# Patient Record
Sex: Female | Born: 2000 | Race: Black or African American | Hispanic: No | Marital: Single | Smoking: Never smoker
Health system: Southern US, Community
[De-identification: ages and names within clinical notes are randomized; demographics above are authoritative.]

---
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
## 2021-08-24 ENCOUNTER — Ambulatory Visit (HOSPITAL_COMMUNITY)
Admission: EM | Admit: 2021-08-24 | Discharge: 2021-08-24 | Disposition: A | Discharge status: Home / Self Care | Payer: Medicaid Other | Attending: Nurse Practitioner | Admitting: Nurse Practitioner

## 2021-08-24 ENCOUNTER — Encounter (HOSPITAL_COMMUNITY): Payer: Self-pay

## 2021-08-24 DIAGNOSIS — J029 Acute pharyngitis, unspecified: Secondary | ICD-10-CM | POA: Insufficient documentation

## 2021-08-24 DIAGNOSIS — J069 Acute upper respiratory infection, unspecified: Secondary | ICD-10-CM | POA: Insufficient documentation

## 2021-08-24 LAB — POCT RAPID STREP A, ED / UC: Streptococcus, Group A Screen (Direct): NEGATIVE

## 2021-08-24 LAB — POC INFLUENZA A AND B ANTIGEN (URGENT CARE ONLY)
INFLUENZA A ANTIGEN, POC: NEGATIVE
INFLUENZA B ANTIGEN, POC: NEGATIVE

## 2021-08-24 MED ORDER — LIDOCAINE VISCOUS HCL 2 % MT SOLN: 5.0000 mL | Freq: Three times a day (TID) | OROMUCOSAL | 0 refills | Status: AC | PRN

## 2021-08-24 NOTE — ED Provider Notes (Signed)
?[facility] ? ? ? ?CSN: [id number] ?Arrival date & time: 08/24/21  1048 ? ? ?  ? ?History   ?Chief Complaint ?Chief Complaint  ?Patient presents with  ? Sore Throat  ? ? ?HPI ?Carla Contreras is a 21 y.o. female.  ? ?The patient is a 21 year old female who presents with her mother for complaints of sore throat.  She also would like to find out if she has toxic shock syndrome because she fell asleep with a tampon in place around 9 PM last night and removed it around 9 AM this morning.  States that she woke up with sore throat, headache, and is concerned that this is related to toxic shock syndrome.  She denies fever, ear pain, nasal congestion, runny nose, or cough.  She also denies any GI symptoms.  Patient has not been vaccinated for influenza or COVID.  She denies any sick contacts.  She has not taken any medication for her symptoms. ? ?The history is provided by the patient.  ? ?History reviewed. No pertinent past medical history. ? ?There are no problems to display for this patient. ? ? ?History reviewed. No pertinent surgical history. ? ?OB History   ?No obstetric history on file. ?  ? ? ? ?Home Medications   ? ?Prior to Admission medications   ?Medication Sig Start Date End Date Taking? Authorizing Provider  ?lidocaine (XYLOCAINE) 2 % solution Use as directed 5 mLs in the mouth or throat every 8 (eight) hours as needed for up to 5 days for mouth pain. Gargle and spit 20mL in the throat/mouth as needed for mouth pain. 08/24/21 08/29/21 Yes Huyen Perazzo-Warren, Sadie Haber, NP  ? ? ?Family History ?Family History  ?Problem Relation Age of Onset  ? Hypertension Mother   ? ? ?Social History ?Social History  ? ?Tobacco Use  ? Smoking status: Never  ? Smokeless tobacco: Never  ?Vaping Use  ? Vaping Use: Never used  ?Substance Use Topics  ? Alcohol use: Never  ? ? ? ?Allergies   ?Patient has no known allergies. ? ? ?Review of Systems ?Review of Systems  ?Constitutional:  Positive for activity change, appetite change,  chills and fatigue.  ?HENT:  Positive for sore throat and trouble swallowing. Negative for congestion, ear pain, sinus pressure, sinus pain and sneezing.   ?Eyes: Negative.   ?Respiratory: Negative.    ?Cardiovascular: Negative.   ?Gastrointestinal: Negative.   ?Skin: Negative.   ?Psychiatric/Behavioral: Negative.    ? ? ?Physical Exam ?Triage Vital Signs ?ED Triage Vitals  ?Enc Vitals Group  ?   BP 08/24/21 1126 130/78  ?   Pulse Rate 08/24/21 1126 91  ?   Resp 08/24/21 1126 18  ?   Temp 08/24/21 1126 98.3 ?F (36.8 ?C)  ?   Temp Source 08/24/21 1126 Oral  ?   SpO2 08/24/21 1126 98 %  ?   Weight --   ?   Height --   ?   Head Circumference --   ?   Peak Flow --   ?   Pain Score 08/24/21 1124 4  ?   Pain Loc --   ?   Pain Edu? --   ?   Excl. in GC? --   ? ?No data found. ? ?Updated Vital Signs ?BP 130/78 (BP Location: Left Arm)   Pulse 91   Temp 98.3 ?F (36.8 ?C) (Oral)   Resp 18   LMP 08/23/2021 (Exact Date)   SpO2 98%  ? ?  Visual Acuity ?Right Eye Distance:   ?Left Eye Distance:   ?Bilateral Distance:   ? ?Right Eye Near:   ?Left Eye Near:    ?Bilateral Near:    ? ?Physical Exam ?Vitals reviewed.  ?Constitutional:   ?   General: She is not in acute distress. ?   Appearance: She is well-developed.  ?HENT:  ?   Head: Normocephalic and atraumatic.  ?   Right Ear: Tympanic membrane and ear canal normal. Tympanic membrane is not erythematous.  ?   Left Ear: Tympanic membrane and ear canal normal. Tympanic membrane is not erythematous.  ?   Nose: No congestion or rhinorrhea.  ?   Mouth/Throat:  ?   Mouth: Mucous membranes are moist.  ?   Pharynx: Uvula midline. Pharyngeal swelling and posterior oropharyngeal erythema present.  ?   Tonsils: No tonsillar exudate. 1+ on the right. 1+ on the left.  ?Eyes:  ?   Conjunctiva/sclera: Conjunctivae normal.  ?   Pupils: Pupils are equal, round, and reactive to light.  ?Cardiovascular:  ?   Rate and Rhythm: Normal rate and regular rhythm.  ?   Heart sounds: Normal heart sounds.   ?Pulmonary:  ?   Effort: Pulmonary effort is normal.  ?   Breath sounds: Normal breath sounds.  ?Abdominal:  ?   General: Bowel sounds are normal.  ?   Palpations: Abdomen is soft.  ?Musculoskeletal:  ?   Cervical back: Normal range of motion and neck supple.  ?Skin: ?   General: Skin is warm and dry.  ?   Capillary Refill: Capillary refill takes less than 2 seconds.  ?Neurological:  ?   General: No focal deficit present.  ?   Mental Status: She is alert and oriented to person, place, and time.  ?Psychiatric:     ?   Mood and Affect: Mood normal.     ?   Behavior: Behavior normal.  ? ? ? ?UC Treatments / Results  ?Labs ?(all labs ordered are listed, but only abnormal results are displayed) ?Labs Reviewed  ?POCT RAPID STREP A, ED / UC  ?POC INFLUENZA A AND B ANTIGEN (URGENT CARE ONLY)  ? ? ?EKG ? ? ?Radiology ?No results found. ? ?Procedures ?Procedures (including critical care time) ? ?Medications Ordered in UC ?Medications - No data to display ? ?Initial Impression / Assessment and Plan / UC Course  ?I have reviewed the triage vital signs and the nursing notes. ? ?Pertinent labs & imaging results that were available during my care of the patient were reviewed by me and considered in my medical decision making (see chart for details). ? ?The patient is a 21 year old female who presents with her mother for complaints of sore throat.  She also states that she is concerned about toxic shock syndrome because she left a tampon in place for approximately 12 hours.  Patient states that she is concerned because she woke up with sore throat, headache, and fatigue.  Patient informed that her symptoms do not have any association with toxic shock syndrome she likely does not have this.  On exam, patient with tonsil swelling bilaterally, without lymphadenopathy or fever.  Rapid strep and influenza test were done, both were negative.  We will send a throat culture.  In the interim we will treat patient symptomatically with  viscous lidocaine for discomfort.  We will also recommend over-the-counter ibuprofen for pain, fever, or general discomfort.  Patient encouraged to increase fluids and get plenty of rest.  Patient's mother advised that she will be contacted if 3 throat culture is positive to provide treatment.  Follow-up as needed. ?Final Clinical Impressions(s) / UC Diagnoses  ? ?Final diagnoses:  ?Viral upper respiratory illness  ?Pharyngitis, unspecified etiology  ? ? ? ?Discharge Instructions   ? ?  ?Your influenza and strep test are negative today.  A throat culture has been ordered.  You will be contacted if the results are positive to provide treatment. ?May take ibuprofen or Tylenol for pain, fever, or general discomfort. ?Take medication as prescribed. ?Increase fluids and get plenty of rest. ?Warm salt water gargles 3-4 times daily as needed for throat pain. ?Follow-up if symptoms do not improve. ? ? ? ? ?ED Prescriptions   ? ? Medication Sig Dispense Auth. Provider  ? lidocaine (XYLOCAINE) 2 % solution Use as directed 5 mLs in the mouth or throat every 8 (eight) hours as needed for up to 5 days for mouth pain. Gargle and spit 5mL in the throat/mouth as needed for mouth pain. 75 mL Nicoya Friel-Warren, Sadie Haberhristie J, NP  ? ?  ? ?PDMP not reviewed this encounter. ?  ?Abran CantorLeath-Warren, Anndrea Mihelich J, NP ?08/24/21 1244 ? ?

## 2021-08-24 NOTE — Discharge Instructions (Addendum)
Your influenza and strep test are negative today.  A throat culture has been ordered.  You will be contacted if the results are positive to provide treatment. ?May take ibuprofen or Tylenol for pain, fever, or general discomfort. ?Take medication as prescribed. ?Increase fluids and get plenty of rest. ?Warm salt water gargles 3-4 times daily as needed for throat pain. ?Follow-up if symptoms do not improve. ?

## 2021-08-24 NOTE — ED Triage Notes (Signed)
Onset this morning of weakness, nausea, chills, HA and sore throat. No meds taken. No v/d. Pt is suspicious for toxic shock syndrome, noting that she placed the tampon around 9p last night and removed it this morning around 9:15a. No cough. No vaginal discharge, itching or irritation. ?

## 2021-08-26 LAB — CULTURE, GROUP A STREP (THRC)

## 2021-08-27 ENCOUNTER — Telehealth (HOSPITAL_COMMUNITY): Payer: Self-pay | Admitting: Emergency Medicine

## 2021-08-27 MED ORDER — AMOXICILLIN 500 MG PO CAPS: 500.0000 mg | ORAL_CAPSULE | Freq: Two times a day (BID) | ORAL | 0 refills | Status: AC

## 2021-09-09 ENCOUNTER — Ambulatory Visit (HOSPITAL_COMMUNITY)
Admission: EM | Admit: 2021-09-09 | Discharge: 2021-09-09 | Disposition: A | Discharge status: Home / Self Care | Payer: Medicaid Other | Attending: Nurse Practitioner | Admitting: Nurse Practitioner

## 2021-09-09 ENCOUNTER — Encounter (HOSPITAL_COMMUNITY): Payer: Self-pay | Admitting: Emergency Medicine

## 2021-09-09 DIAGNOSIS — N760 Acute vaginitis: Secondary | ICD-10-CM | POA: Diagnosis not present

## 2021-09-09 DIAGNOSIS — N390 Urinary tract infection, site not specified: Secondary | ICD-10-CM | POA: Diagnosis not present

## 2021-09-09 LAB — POC URINE PREG, ED: Preg Test, Ur: NEGATIVE

## 2021-09-09 LAB — POCT URINALYSIS DIPSTICK, ED / UC
Bilirubin Urine: NEGATIVE
Glucose, UA: NEGATIVE mg/dL
Hgb urine dipstick: NEGATIVE
Ketones, ur: NEGATIVE mg/dL
Nitrite: POSITIVE — AB
Protein, ur: NEGATIVE mg/dL
Specific Gravity, Urine: 1.015 (ref 1.005–1.030)
Urobilinogen, UA: 0.2 mg/dL (ref 0.0–1.0)
pH: 7.5 (ref 5.0–8.0)

## 2021-09-09 MED ORDER — NITROFURANTOIN MONOHYD MACRO 100 MG PO CAPS: 100.0000 mg | ORAL_CAPSULE | Freq: Two times a day (BID) | ORAL | 0 refills | Status: AC

## 2021-09-09 MED ORDER — FLUCONAZOLE 150 MG PO TABS: 150.0000 mg | ORAL_TABLET | Freq: Every day | ORAL | 0 refills | Status: DC

## 2021-09-09 NOTE — ED Triage Notes (Signed)
Pt reports vaginal itching and small amount of vaginal discharge x 1 day. States she think she has a yeast infection.  ?

## 2021-09-09 NOTE — ED Provider Notes (Signed)
?[facility] ? ? ? ?CSN: [id number] ?Arrival date & time: 09/09/21  1100 ? ? ?  ? ?History   ?Chief Complaint ?Chief Complaint  ?Patient presents with  ? Vaginal Itching  ? ? ?HPI ?Carla Contreras is a 21 y.o. female.  ? ?The patient is a 21 year old female who presents with vaginal symptoms.  Patient complains of vaginal itching, irritation, and discharge.  Symptoms started 1 day ago.  Patient also states that she has noted some swelling to her labia as a result of the irritation.  She denies vaginal odor, urinary frequency, urgency, hesitancy, or pelvic pain.  She is sexually active with 1 female partner, they currently do not use condoms.  Her last menstrual cycle was on 08/25/2021. ? ?The history is provided by the patient.  ? ?History reviewed. No pertinent past medical history. ? ?There are no problems to display for this patient. ? ? ?History reviewed. No pertinent surgical history. ? ?OB History   ?No obstetric history on file. ?  ? ? ? ?Home Medications   ? ?Prior to Admission medications   ?Medication Sig Start Date End Date Taking? Authorizing Provider  ?fluconazole (DIFLUCAN) 150 MG tablet Take 1 tablet (150 mg total) by mouth daily. Take one tablet today. If symptoms persist after 72 hours, take the second tablet. 09/09/21  Yes Berthel Bagnall-Warren, Sadie Haber, NP  ?nitrofurantoin, macrocrystal-monohydrate, (MACROBID) 100 MG capsule Take 1 capsule (100 mg total) by mouth 2 (two) times daily for 5 days. 09/09/21 09/14/21 Yes Niemah Schwebke-Warren, Sadie Haber, NP  ? ? ?Family History ?Family History  ?Problem Relation Age of Onset  ? Hypertension Mother   ? ? ?Social History ?Social History  ? ?Tobacco Use  ? Smoking status: Never  ? Smokeless tobacco: Never  ?Vaping Use  ? Vaping Use: Never used  ?Substance Use Topics  ? Alcohol use: Never  ? ? ? ?Allergies   ?Patient has no known allergies. ? ? ?Review of Systems ?Review of Systems  ?Constitutional: Negative.   ?Gastrointestinal: Negative.   ?Genitourinary:  Positive  for vaginal discharge and vaginal pain.  ?Skin: Negative.   ?Psychiatric/Behavioral: Negative.    ? ? ?Physical Exam ?Triage Vital Signs ?ED Triage Vitals  ?Enc Vitals Group  ?   BP 09/09/21 1312 129/81  ?   Pulse Rate 09/09/21 1312 61  ?   Resp 09/09/21 1312 17  ?   Temp 09/09/21 1312 99.1 ?F (37.3 ?C)  ?   Temp Source 09/09/21 1312 Oral  ?   SpO2 09/09/21 1311 98 %  ?   Weight 09/09/21 1311 164 lb 14.5 oz (74.8 kg)  ?   Height --   ?   Head Circumference --   ?   Peak Flow --   ?   Pain Score 09/09/21 1311 0  ?   Pain Loc --   ?   Pain Edu? --   ?   Excl. in GC? --   ? ?No data found. ? ?Updated Vital Signs ?BP 129/81 (BP Location: Left Arm)   Pulse 61   Temp 99.1 ?F (37.3 ?C) (Oral)   Resp 17   Wt 164 lb 14.5 oz (74.8 kg)   LMP 08/23/2021 (Exact Date)   SpO2 98%  ? ?Visual Acuity ?Right Eye Distance:   ?Left Eye Distance:   ?Bilateral Distance:   ? ?Right Eye Near:   ?Left Eye Near:    ?Bilateral Near:    ? ?Physical Exam ?Vitals reviewed.  ?Constitutional:   ?  General: She is not in acute distress. ?   Appearance: Normal appearance.  ?HENT:  ?   Head: Normocephalic.  ?Cardiovascular:  ?   Rate and Rhythm: Normal rate and regular rhythm.  ?Pulmonary:  ?   Effort: Pulmonary effort is normal.  ?   Breath sounds: Normal breath sounds.  ?Abdominal:  ?   General: Bowel sounds are normal.  ?   Palpations: Abdomen is soft.  ?Skin: ?   General: Skin is warm and dry.  ?   Capillary Refill: Capillary refill takes less than 2 seconds.  ?Neurological:  ?   General: No focal deficit present.  ?   Mental Status: She is alert and oriented to person, place, and time.  ?Psychiatric:     ?   Mood and Affect: Mood normal.     ?   Behavior: Behavior normal.  ? ? ? ?UC Treatments / Results  ?Labs ?(all labs ordered are listed, but only abnormal results are displayed) ?Labs Reviewed  ?POCT URINALYSIS DIPSTICK, ED / UC - Abnormal; Notable for the following components:  ?    Result Value  ? Nitrite POSITIVE (*)   ? Leukocytes,Ua  MODERATE (*)   ? All other components within normal limits  ?POC URINE PREG, ED  ?CERVICOVAGINAL ANCILLARY ONLY  ? ? ?EKG ? ? ?Radiology ?No results found. ? ?Procedures ?Procedures (including critical care time) ? ?Medications Ordered in UC ?Medications - No data to display ? ?Initial Impression / Assessment and Plan / UC Course  ?I have reviewed the triage vital signs and the nursing notes. ? ?Pertinent labs & imaging results that were available during my care of the patient were reviewed by me and considered in my medical decision making (see chart for details). ? ?The patient is a 21 year old female who presents with vaginal symptoms.  Symptoms have been present for 1 day.  Symptoms include vaginal irritation and discharge.  She denies any urinary symptoms.  STI testing was performed today.  Urine pregnancy. Urinalysis is positive for nitrites and leukocytes. Patient was advised that he results of her cytology test should be available within the next 24 to 48 hours.  She was advised she will be contacted if her results are positive to provide treatment.  Symptoms are consistent with a vaginitis and cystitis.  There is no concern for pyelonephritis in the absence of fever, CVA tenderness. Awaiting cytology results to ensure symptoms are not related to an STI.  In the interim, will treat with fluconazole and macrobid.  ?Final Clinical Impressions(s) / UC Diagnoses  ? ?Final diagnoses:  ?Vaginitis and vulvovaginitis  ?Acute UTI  ? ? ? ?Discharge Instructions   ? ?  ?Your urinalysis shows that you do have a urinary tract infection.  I have sent a prescription for Macrobid to the pharmacy.  Take 1 pill twice daily for 5 days.  I have also prescribed Fluconazole for your vaginal symptoms. ?Your urine pregnancy test is negative. ?Increase fluids.  You should be drinking at least 64 ounces of water daily. ?Void every 2 hours. ?You should void 15 to 20 minutes after intercourse to avoid development of a urinary tract  infection. ?You will be contacted if your cytology results are positive to provide treatment. ?Follow-up as needed. ? ? ? ? ?ED Prescriptions   ? ? Medication Sig Dispense Auth. Provider  ? fluconazole (DIFLUCAN) 150 MG tablet Take 1 tablet (150 mg total) by mouth daily. Take one tablet today. If symptoms persist after  72 hours, take the second tablet. 2 tablet Meilah Delrosario-Warren, Sadie Haberhristie J, NP  ? nitrofurantoin, macrocrystal-monohydrate, (MACROBID) 100 MG capsule Take 1 capsule (100 mg total) by mouth 2 (two) times daily for 5 days. 10 capsule Torre Schaumburg-Warren, Sadie Haberhristie J, NP  ? ?  ? ?PDMP not reviewed this encounter. ?  ?Abran CantorLeath-Warren, Meziah Blasingame J, NP ?09/09/21 1400 ? ?

## 2021-09-09 NOTE — Discharge Instructions (Addendum)
Your urinalysis shows that you do have a urinary tract infection.  I have sent a prescription for Macrobid to the pharmacy.  Take 1 pill twice daily for 5 days.  I have also prescribed Fluconazole for your vaginal symptoms. ?Your urine pregnancy test is negative. ?Increase fluids.  You should be drinking at least 64 ounces of water daily. ?Void every 2 hours. ?You should void 15 to 20 minutes after intercourse to avoid development of a urinary tract infection. ?You will be contacted if your cytology results are positive to provide treatment. ?Follow-up as needed. ?

## 2021-09-11 ENCOUNTER — Telehealth (HOSPITAL_COMMUNITY): Payer: Self-pay | Admitting: Emergency Medicine

## 2021-09-11 LAB — CERVICOVAGINAL ANCILLARY ONLY
Bacterial Vaginitis (gardnerella): POSITIVE — AB
Candida Glabrata: POSITIVE — AB
Candida Vaginitis: POSITIVE — AB
Chlamydia: NEGATIVE
Comment: NEGATIVE
Comment: NEGATIVE
Comment: NEGATIVE
Comment: NEGATIVE
Comment: NEGATIVE
Comment: NORMAL
Neisseria Gonorrhea: NEGATIVE
Trichomonas: NEGATIVE

## 2021-09-11 MED ORDER — METRONIDAZOLE 500 MG PO TABS: 500.0000 mg | ORAL_TABLET | Freq: Two times a day (BID) | ORAL | 0 refills | Status: DC

## 2023-02-13 ENCOUNTER — Ambulatory Visit (INDEPENDENT_AMBULATORY_CARE_PROVIDER_SITE_OTHER): Payer: Medicaid Other

## 2023-02-13 ENCOUNTER — Encounter (HOSPITAL_COMMUNITY): Payer: Self-pay | Admitting: *Deleted

## 2023-02-13 ENCOUNTER — Other Ambulatory Visit: Payer: Self-pay

## 2023-02-13 ENCOUNTER — Ambulatory Visit (HOSPITAL_COMMUNITY)
Admission: EM | Admit: 2023-02-13 | Discharge: 2023-02-13 | Disposition: A | Discharge status: Home / Self Care | Payer: Medicaid Other | Attending: Emergency Medicine | Admitting: Emergency Medicine

## 2023-02-13 DIAGNOSIS — S8011XA Contusion of right lower leg, initial encounter: Secondary | ICD-10-CM

## 2023-02-13 DIAGNOSIS — L03115 Cellulitis of right lower limb: Secondary | ICD-10-CM

## 2023-02-13 MED ORDER — SULFAMETHOXAZOLE-TRIMETHOPRIM 800-160 MG PO TABS
1.0000 | ORAL_TABLET | Freq: Two times a day (BID) | ORAL | 0 refills | Status: DC
Start: 2023-02-13 — End: 2023-02-17

## 2023-02-13 NOTE — ED Triage Notes (Signed)
Pt hit her RT lower anterior leg on the edge of bed about one week ago. Pt has a redden ares that is swolled and has not improved.

## 2023-02-13 NOTE — Discharge Instructions (Addendum)
Take antibiotic for cellulitis Motrin or tylenol three times daily as needed for pain.  Final results of xray have not been completed but initial review is negative for any fracture Follow up with PCP if continued problems

## 2023-02-13 NOTE — ED Provider Notes (Signed)
[facility]    CSN: [id number] Arrival date & time: 02/13/23  1645      History   Chief Complaint Chief Complaint  Patient presents with   Leg Injury    HPI Carla Contreras is a 22 y.o. female.   Patient is reporting injury to right shin x 1 week ago.  Pain has worsened and is now difficulty walking.  She is requesting x-ray to rule out a bone issue.  The history is provided by the patient.    History reviewed. No pertinent past medical history.  There are no problems to display for this patient.   History reviewed. No pertinent surgical history.  OB History   No obstetric history on file.      Home Medications    Prior to Admission medications   Medication Sig Start Date End Date Taking? Authorizing Provider  sulfamethoxazole-trimethoprim (BACTRIM DS) 800-160 MG tablet Take 1 tablet by mouth 2 (two) times daily for 5 days. 02/13/23 02/18/23 Yes Lataria Courser, Linde Gillis, NP  fluconazole (DIFLUCAN) 150 MG tablet Take 1 tablet (150 mg total) by mouth daily. Take one tablet today. If symptoms persist after 72 hours, take the second tablet. 09/09/21   Leath-Warren, Sadie Haber, NP  metroNIDAZOLE (FLAGYL) 500 MG tablet Take 1 tablet (500 mg total) by mouth 2 (two) times daily. 09/11/21   Lamptey, Britta Mccreedy, MD    Family History Family History  Problem Relation Age of Onset   Hypertension Mother     Social History Social History   Tobacco Use   Smoking status: Never   Smokeless tobacco: Never  Vaping Use   Vaping status: Never Used  Substance Use Topics   Alcohol use: Never     Allergies   Patient has no known allergies.   Review of Systems Review of Systems  Musculoskeletal:        Right shin injury with swelling and redness  All other systems reviewed and are negative.    Physical Exam Triage Vital Signs ED Triage Vitals  Encounter Vitals Group     BP 02/13/23 1728 124/73     Systolic BP Percentile --      Diastolic BP Percentile --       Pulse Rate 02/13/23 1728 67     Resp 02/13/23 1728 16     Temp 02/13/23 1728 98.5 F (36.9 C)     Temp src --      SpO2 02/13/23 1728 98 %     Weight --      Height --      Head Circumference --      Peak Flow --      Pain Score 02/13/23 1726 6     Pain Loc --      Pain Education --      Exclude from Growth Chart --    No data found.  Updated Vital Signs BP 124/73   Pulse 67   Temp 98.5 F (36.9 C)   Resp 16   LMP 02/03/2023 (Exact Date)   SpO2 98%   Visual Acuity Right Eye Distance:   Left Eye Distance:   Bilateral Distance:    Right Eye Near:   Left Eye Near:    Bilateral Near:     Physical Exam Vitals and nursing note reviewed.  Musculoskeletal:        General: Swelling and tenderness present.     Right lower leg: Swelling and tenderness present.  Legs:     Comments: Healing wound noted on right shin.  No bruising.  Swelling with a 1 inch induration to the right lateral area of the shin.  Neurological:     Mental Status: She is alert.      UC Treatments / Results  Labs (all labs ordered are listed, but only abnormal results are displayed) Labs Reviewed - No data to display  EKG   Radiology DG Tibia/Fibula Right  Result Date: 02/13/2023 CLINICAL DATA:  Injury, pain. EXAM: RIGHT TIBIA AND FIBULA - 2 VIEW COMPARISON:  None Available. FINDINGS: There is no evidence of fracture or other focal bone lesions. Cortical margins of the tibia and fibular intact. Knee and ankle alignment are maintained. Mild soft tissue edema. No radiopaque foreign body or soft tissue gas. IMPRESSION: Mild soft tissue edema. No fracture. Electronically Signed   By: Narda Rutherford M.D.   On: 02/13/2023 19:21    Procedures Procedures (including critical care time)  Medications Ordered in UC Medications - No data to display  Initial Impression / Assessment and Plan / UC Course  I have reviewed the triage vital signs and the nursing notes.  Pertinent labs & imaging  results that were available during my care of the patient were reviewed by me and considered in my medical decision making (see chart for details).  Patient has contusion to the right shin.  There is an area of previous abrasion that is healing.  She reports running into the edge of the bed.  Patient is reporting that the pain has become worse, swelling has become worse and she is concerned for a bone chip or some kind of a shin injury.   On exam,there is a healing wound.  The area is hot to touch, tender, and swollen.  Probable cellulitis.  We will order antibiotics and complete x-ray.  She is encouraged to follow-up with PCP or return to clinic if worsening of symptoms.  X-ray was negative for fracture but did show tissue swelling.  Final Clinical Impressions(s) / UC Diagnoses   Final diagnoses:  Cellulitis of leg, right  Contusion of right lower leg, initial encounter     Discharge Instructions      Take antibiotic for cellulitis Motrin or tylenol three times daily as needed for pain.  Final results of xray have not been completed but initial review is negative for any fracture Follow up with PCP if continued problems     ED Prescriptions     Medication Sig Dispense Auth. Provider   sulfamethoxazole-trimethoprim (BACTRIM DS) 800-160 MG tablet Take 1 tablet by mouth 2 (two) times daily for 5 days. 10 tablet Marvel Mcphillips, Linde Gillis, NP      PDMP not reviewed this encounter.   Nelda Marseille, NP 02/13/23 2115

## 2023-02-17 ENCOUNTER — Encounter (HOSPITAL_COMMUNITY): Payer: Self-pay

## 2023-02-17 ENCOUNTER — Other Ambulatory Visit: Payer: Self-pay

## 2023-02-17 ENCOUNTER — Ambulatory Visit (HOSPITAL_COMMUNITY)
Admission: EM | Admit: 2023-02-17 | Discharge: 2023-02-17 | Disposition: A | Discharge status: Home / Self Care | Payer: Medicaid Other | Attending: Family Medicine | Admitting: Family Medicine

## 2023-02-17 ENCOUNTER — Emergency Department (HOSPITAL_COMMUNITY)
Admission: EM | Admit: 2023-02-17 | Discharge: 2023-02-17 | Discharge status: Against Medical Advice | Payer: Medicaid Other | Attending: Emergency Medicine | Admitting: Emergency Medicine

## 2023-02-17 ENCOUNTER — Encounter (HOSPITAL_COMMUNITY): Payer: Self-pay | Admitting: *Deleted

## 2023-02-17 DIAGNOSIS — Z5321 Procedure and treatment not carried out due to patient leaving prior to being seen by health care provider: Secondary | ICD-10-CM | POA: Diagnosis not present

## 2023-02-17 DIAGNOSIS — L0291 Cutaneous abscess, unspecified: Secondary | ICD-10-CM | POA: Diagnosis not present

## 2023-02-17 DIAGNOSIS — L02415 Cutaneous abscess of right lower limb: Secondary | ICD-10-CM | POA: Insufficient documentation

## 2023-02-17 LAB — URINALYSIS, ROUTINE W REFLEX MICROSCOPIC
Bilirubin Urine: NEGATIVE
Glucose, UA: NEGATIVE mg/dL
Hgb urine dipstick: NEGATIVE
Ketones, ur: NEGATIVE mg/dL
Nitrite: NEGATIVE
Protein, ur: NEGATIVE mg/dL
Specific Gravity, Urine: 1.012 (ref 1.005–1.030)
pH: 6 (ref 5.0–8.0)

## 2023-02-17 LAB — COMPREHENSIVE METABOLIC PANEL
ALT: 20 U/L (ref 0–44)
AST: 18 U/L (ref 15–41)
Albumin: 3.9 g/dL (ref 3.5–5.0)
Alkaline Phosphatase: 46 U/L (ref 38–126)
Anion gap: 10 (ref 5–15)
BUN: 7 mg/dL (ref 6–20)
CO2: 22 mmol/L (ref 22–32)
Calcium: 9 mg/dL (ref 8.9–10.3)
Chloride: 105 mmol/L (ref 98–111)
Creatinine, Ser: 1 mg/dL (ref 0.44–1.00)
GFR, Estimated: >60 mL/min (ref >60)
Glucose, Bld: 120 mg/dL — ABNORMAL HIGH (ref 70–99)
Potassium: 3.9 mmol/L (ref 3.5–5.1)
Sodium: 137 mmol/L (ref 135–145)
Total Bilirubin: 0.5 mg/dL (ref 0.3–1.2)
Total Protein: 7.1 g/dL (ref 6.5–8.1)

## 2023-02-17 LAB — CBC
HCT: 36.2 % (ref 36.0–46.0)
Hemoglobin: 12 g/dL (ref 12.0–15.0)
MCH: 29.1 pg (ref 26.0–34.0)
MCHC: 33.1 g/dL (ref 30.0–36.0)
MCV: 87.7 fL (ref 80.0–100.0)
Platelets: 221 10*3/uL (ref 150–400)
RBC: 4.13 MIL/uL (ref 3.87–5.11)
RDW: 12.8 % (ref 11.5–15.5)
WBC: 7.5 10*3/uL (ref 4.0–10.5)
nRBC: 0 % (ref 0.0–0.2)

## 2023-02-17 LAB — HCG, SERUM, QUALITATIVE: Preg, Serum: NEGATIVE

## 2023-02-17 MED ORDER — DOXYCYCLINE HYCLATE 100 MG PO CAPS: 100.0000 mg | ORAL_CAPSULE | Freq: Two times a day (BID) | ORAL | 0 refills | Status: DC

## 2023-02-17 MED ORDER — LIDOCAINE HCL 2 % IJ SOLN
INTRAMUSCULAR | Status: AC
  Filled 2023-02-17: qty 20

## 2023-02-17 NOTE — ED Triage Notes (Addendum)
Pt presents with abscess to ht middle of her right lower leg. Pt reports some pain and swelling. Pt is on day 3 of abx.

## 2023-02-17 NOTE — Discharge Instructions (Signed)
Please take your antibiotic 1 pill twice a day for the next 10 days.  You may wash over the area later today.  Please let us know if you are not feeling better or if it is getting worse.

## 2023-02-17 NOTE — ED Provider Notes (Signed)
[facility]    CSN: [id number] Arrival date & time: 02/17/23  0849      History   Chief Complaint Chief Complaint  Patient presents with   Abscess    HPI Carla Contreras is a 22 y.o. female.   Patient is presenting with swelling, redness and abscess of her right lower extremity.  Patient was seen recently regarding the same affected area and was diagnosed with cellulitis and was put on Bactrim.  Patient denies any systemic fevers, chills or any other concerns.  Patient states that she noticed the area started to get Akram bit more swollen than it was previously and she is currently on day 4 of her antibiotics.  Patient notes no other concerns at this time other than the swelling is responded to pressure she was taking the right medication.   Abscess   History reviewed. No pertinent past medical history.  There are no problems to display for this patient.   History reviewed. No pertinent surgical history.  OB History   No obstetric history on file.      Home Medications    Prior to Admission medications   Medication Sig Start Date End Date Taking? Authorizing Provider  doxycycline (VIBRAMYCIN) 100 MG capsule Take 1 capsule (100 mg total) by mouth 2 (two) times daily. 02/17/23  Yes Brenton Grills, MD  fluconazole (DIFLUCAN) 150 MG tablet Take 1 tablet (150 mg total) by mouth daily. Take one tablet today. If symptoms persist after 72 hours, take the second tablet. 09/09/21   Leath-Warren, Sadie Haber, NP  metroNIDAZOLE (FLAGYL) 500 MG tablet Take 1 tablet (500 mg total) by mouth 2 (two) times daily. 09/11/21   Lamptey, Britta Mccreedy, MD    Family History Family History  Problem Relation Age of Onset   Hypertension Mother     Social History Social History   Tobacco Use   Smoking status: Never   Smokeless tobacco: Never  Vaping Use   Vaping status: Never Used  Substance Use Topics   Alcohol use: Never   Drug use: Never     Allergies   Patient has no known  allergies.   Review of Systems Review of Systems   Physical Exam Triage Vital Signs ED Triage Vitals [02/17/23 0913]  Encounter Vitals Group     BP 122/71     Systolic BP Percentile      Diastolic BP Percentile      Pulse Rate 74     Resp 16     Temp 98.2 F (36.8 C)     Temp Source Oral     SpO2 97 %     Weight      Height      Head Circumference      Peak Flow      Pain Score      Pain Loc      Pain Education      Exclude from Growth Chart    No data found.  Updated Vital Signs BP 122/71 (BP Location: Left Arm)   Pulse 74   Temp 98.2 F (36.8 C) (Oral)   Resp 16   LMP 02/03/2023 (Exact Date)   SpO2 97%   Visual Acuity Right Eye Distance:   Left Eye Distance:   Bilateral Distance:    Right Eye Near:   Left Eye Near:    Bilateral Near:     Physical Exam Constitutional:      Appearance: Normal appearance.  Cardiovascular:  Rate and Rhythm: Normal rate and regular rhythm.     Pulses: Normal pulses.     Heart sounds: Normal heart sounds.  Skin:    General: Skin is warm.     Coloration: Skin is not jaundiced.     Findings: Abscess, erythema and rash present.          Comments: There is a small 0.5 x 0.5 inch abscess located on the anterior.  There is some erythema that as well as some tenderness to palpation of the affected area.  Neurological:     General: No focal deficit present.     Mental Status: She is alert.      UC Treatments / Results  Labs (all labs ordered are listed, but only abnormal results are displayed) Labs Reviewed - No data to display  EKG   Radiology No results found.  Procedures Incision and Drainage  Date/Time: 02/17/2023 9:36 AM  Performed by: Brenton Grills, MD Authorized by: Brenton Grills, MD   Consent:    Consent obtained:  Verbal   Consent given by:  Patient   Risks, benefits, and alternatives were discussed: yes     Risks discussed:  Bleeding, incomplete drainage, pain and infection   Alternatives  discussed:  No treatment Universal protocol:    Patient identity confirmed:  Verbally with patient Location:    Type:  Abscess   Location:  Lower extremity   Lower extremity location:  Leg   Leg location:  R upper leg Pre-procedure details:    Skin preparation:  Povidone-iodine Sedation:    Sedation type:  None Anesthesia:    Anesthesia method:  Local infiltration   Local anesthetic:  Lidocaine 1% w/o epi Procedure type:    Complexity:  Simple Procedure details:    Incision types:  Single straight   Wound management:  Probed and deloculated   Wound treatment:  Wound left open Post-procedure details:    Procedure completion:  Tolerated well, no immediate complications Comments:     Steri-Strips placed  (including critical care time)  Medications Ordered in UC Medications - No data to display  Initial Impression / Assessment and Plan / UC Course  I have reviewed the triage vital signs and the nursing notes.  Pertinent labs & imaging results that were available during my care of the patient were reviewed by me and considered in my medical decision making (see chart for details).     Patient with formation of an abscess after previous diagnosis of cellulitis.  Patient is on Bactrim and will be finishing last dose tomorrow.  Patient had abscess drained without any difficulty.  Cultures were sent off however at this time given minimal improvement with previous antibiotics, will change antibiotics and prolong a longer course.  Will do doxycycline 100 mg twice a day for the next 10 days.  Patient vies to follow-up if any fevers, chills or worsening symptoms.  Will change antibiotics if necessary pending culture results. Final Clinical Impressions(s) / UC Diagnoses   Final diagnoses:  Abscess     Discharge Instructions      Please take your antibiotic 1 pill twice a day for the next 10 days.  You may wash over the area later today.  Please let us know if you are not feeling  better or if it is getting worse.     ED Prescriptions     Medication Sig Dispense Auth. Provider   doxycycline (VIBRAMYCIN) 100 MG capsule Take 1 capsule (100 mg total) by  mouth 2 (two) times daily. 20 capsule Brenton Grills, MD      PDMP not reviewed this encounter.   Brenton Grills, MD 02/17/23 1003

## 2023-02-17 NOTE — ED Triage Notes (Signed)
The pt has an abscess ro the middle of her rt lower leg  she struck it on a bed rail 2 weeks ago  she has been onantinbitics for 4 days but there is more selling and more redness

## 2023-02-19 LAB — AEROBIC CULTURE W GRAM STAIN (SUPERFICIAL SPECIMEN)

## 2023-09-26 ENCOUNTER — Encounter (HOSPITAL_COMMUNITY): Payer: Self-pay

## 2023-09-26 ENCOUNTER — Ambulatory Visit (HOSPITAL_COMMUNITY)
Admission: EM | Admit: 2023-09-26 | Discharge: 2023-09-26 | Disposition: A | Discharge status: Home / Self Care | Attending: Physician Assistant | Admitting: Physician Assistant

## 2023-09-26 DIAGNOSIS — R8290 Unspecified abnormal findings in urine: Secondary | ICD-10-CM | POA: Diagnosis present

## 2023-09-26 DIAGNOSIS — N898 Other specified noninflammatory disorders of vagina: Secondary | ICD-10-CM | POA: Insufficient documentation

## 2023-09-26 DIAGNOSIS — N939 Abnormal uterine and vaginal bleeding, unspecified: Secondary | ICD-10-CM | POA: Insufficient documentation

## 2023-09-26 LAB — POCT URINALYSIS DIP (MANUAL ENTRY)
Bilirubin, UA: NEGATIVE
Blood, UA: NEGATIVE
Glucose, UA: NEGATIVE mg/dL
Nitrite, UA: NEGATIVE
Protein Ur, POC: NEGATIVE mg/dL
Spec Grav, UA: 1.02 (ref 1.010–1.025)
Urobilinogen, UA: 0.2 U/dL
pH, UA: 6.5 (ref 5.0–8.0)

## 2023-09-26 LAB — POCT URINE PREGNANCY: Preg Test, Ur: NEGATIVE

## 2023-09-26 MED ORDER — METRONIDAZOLE 500 MG PO TABS: 500.0000 mg | ORAL_TABLET | Freq: Two times a day (BID) | ORAL | 0 refills | Status: AC

## 2023-09-26 NOTE — Discharge Instructions (Signed)
 I did not see any bleeding on your exam.  I suspect the spotting you are seeing is related to irritation of the vagina from the discharge.  We are starting you on metronidazole  to cover for bacterial vaginosis.  Do not drink any alcohol while on this medication and for 3 days after you finish the medicine.  We will contact you if we need to arrange additional treatment based on your swab results.  You had a few white blood cells on your urine.  I suspect this is more related to your vaginal irritation but we will send this for culture and contact you if we need to change your treatment plan.  Wear loosefitting underwear and use hypoallergenic soaps and detergents.  If your symptoms are not improving or if anything worsens and you have pelvic pain, abnormal bleeding, persistent discharge, pelvic pain, fever, nausea, vomiting you need to be seen immediately.

## 2023-09-26 NOTE — ED Provider Notes (Signed)
 [facility]    CSN: [id number] Arrival date & time: 09/26/23  1622      History   Chief Complaint Chief Complaint  Patient presents with   Vaginal Discharge   Vaginal Bleeding    HPI Carla Contreras is a 23 y.o. female.   Patient presents today with a 2-week history of lower abdominal discomfort.  She has also had vaginal discharge as well as intermittent vaginal bleeding.  She reports that she had a normal menstrual cycle but has had intermittent spotting for the past few weeks which is very abnormal for her.  She also has associated discharge that is described as yellow with an odor.  She has no specific concern for STI but is interested in testing.  She reports that the blood is a small amount and she believes it is more related to vaginal irritation than true bleeding.  Denies any chest pain, shortness of breath, palpitations.  Denies any changes to personal hygiene products including soaps or detergents.  Has any recent antibiotics.  She has not tried any over-the-counter medication for symptom management.    History reviewed. No pertinent past medical history.  There are no active problems to display for this patient.   History reviewed. No pertinent surgical history.  OB History   No obstetric history on file.      Home Medications    Prior to Admission medications   Medication Sig Start Date End Date Taking? Authorizing Provider  metroNIDAZOLE  (FLAGYL ) 500 MG tablet Take 1 tablet (500 mg total) by mouth 2 (two) times daily. 09/26/23  Yes Ranard Harte, Betsey Brow, PA-C    Family History Family History  Problem Relation Age of Onset   Hypertension Mother     Social History Social History   Tobacco Use   Smoking status: Never   Smokeless tobacco: Never  Vaping Use   Vaping status: Never Used  Substance Use Topics   Alcohol use: Never   Drug use: Never     Allergies   Patient has no known allergies.   Review of Systems Review of Systems   Constitutional:  Negative for activity change, appetite change, fatigue and fever.  Respiratory:  Negative for shortness of breath.   Cardiovascular:  Negative for palpitations.  Gastrointestinal:  Positive for abdominal pain (lower). Negative for diarrhea, nausea and vomiting.  Genitourinary:  Positive for vaginal bleeding and vaginal discharge. Negative for dysuria, frequency, pelvic pain, urgency and vaginal pain.  Musculoskeletal:  Negative for arthralgias, back pain and myalgias.     Physical Exam Triage Vital Signs ED Triage Vitals  Encounter Vitals Group     BP 09/26/23 1713 130/81     Systolic BP Percentile --      Diastolic BP Percentile --      Pulse Rate 09/26/23 1713 (!) 52     Resp 09/26/23 1713 16     Temp 09/26/23 1713 98.6 F (37 C)     Temp Source 09/26/23 1713 Oral     SpO2 09/26/23 1713 99 %     Weight --      Height --      Head Circumference --      Peak Flow --      Pain Score 09/26/23 1712 0     Pain Loc --      Pain Education --      Exclude from Growth Chart --    No data found.  Updated Vital Signs BP 130/81 (BP Location:  Right Arm)   Pulse (!) 52   Temp 98.6 F (37 C) (Oral)   Resp 16   LMP 09/12/2023 (Approximate)   SpO2 99%   Visual Acuity Right Eye Distance:   Left Eye Distance:   Bilateral Distance:    Right Eye Near:   Left Eye Near:    Bilateral Near:     Physical Exam Vitals reviewed. Exam conducted with a chaperone present.  Constitutional:      General: She is awake. She is not in acute distress.    Appearance: Normal appearance. She is well-developed. She is not ill-appearing.     Comments: Very pleasant female appears stated age in no acute distress sitting comfortably in exam room  HENT:     Head: Normocephalic and atraumatic.  Cardiovascular:     Rate and Rhythm: Normal rate and regular rhythm.     Heart sounds: Normal heart sounds, S1 normal and S2 normal. No murmur heard. Pulmonary:     Effort: Pulmonary effort  is normal.     Breath sounds: Normal breath sounds. No wheezing, rhonchi or rales.     Comments: Clear to auscultation bilaterally Abdominal:     General: Bowel sounds are normal.     Palpations: Abdomen is soft.     Tenderness: There is no abdominal tenderness. There is no right CVA tenderness, left CVA tenderness, guarding or rebound.     Comments: Benign abdominal exam  Genitourinary:    Labia:        Right: No rash or tenderness.        Left: No rash or tenderness.      Vagina: Vaginal discharge present. No bleeding.     Cervix: Normal. No cervical motion tenderness or cervical bleeding.     Uterus: Normal.      Adnexa: Right adnexa normal and left adnexa normal.       Right: No mass or tenderness.         Left: No mass or tenderness.       Comments: Aly, RT present as chaperone during exam.  Thick yellow discharge noted posterior vaginal vault without bleeding.  No CMT or adnexal tenderness. Psychiatric:        Behavior: Behavior is cooperative.      UC Treatments / Results  Labs (all labs ordered are listed, but only abnormal results are displayed) Labs Reviewed  POCT URINALYSIS DIP (MANUAL ENTRY) - Abnormal; Notable for the following components:      Result Value   Clarity, UA cloudy (*)    Ketones, POC UA moderate (40) (*)    Leukocytes, UA Small (1+) (*)    All other components within normal limits  URINE CULTURE  POCT URINE PREGNANCY    EKG   Radiology No results found.  Procedures Procedures (including critical care time)  Medications Ordered in UC Medications - No data to display  Initial Impression / Assessment and Plan / UC Course  I have reviewed the triage vital signs and the nursing notes.  Pertinent labs & imaging results that were available during my care of the patient were reviewed by me and considered in my medical decision making (see chart for details).     Patient is well-appearing, afebrile, nontoxic, nontachycardic.  Urine  pregnancy was negative in clinic today.  UA did have trace blood and leukocytes, however, she denies any urinary symptoms and I suspect this is more related to vaginitis.  Will send this for culture and contact her  if we need to start additional antibiotics to treat UTI.  No bleeding was noted on exam I suspect the small amount of spotting she is noticing is more related to vaginal irritation than true AUB.  She was noted to have discharge on exam and so will empirically treat for bacterial vaginosis with metronidazole .  Discussed that she should avoid alcohol while on this medication and for 3 days after completing course to prevent vomiting due to Antabuse side effects.  STI swab was collected and is pending.  We will contact her if need to arrange additional treatment.  She had no CMT or concerning exam findings for PID.  We discussed that if she has any worsening or changing symptoms including abdominal pain, pelvic pain, fever, nausea, vomiting, change discharge, heavy vaginal bleeding she was to be seen emergently.  Strict return precautions given.  All questions were answered to patient satisfaction.  Final Clinical Impressions(s) / UC Diagnoses   Final diagnoses:  Vaginal discharge  Abnormal urinalysis  Vaginal spotting     Discharge Instructions      I did not see any bleeding on your exam.  I suspect the spotting you are seeing is related to irritation of the vagina from the discharge.  We are starting you on metronidazole  to cover for bacterial vaginosis.  Do not drink any alcohol while on this medication and for 3 days after you finish the medicine.  We will contact you if we need to arrange additional treatment based on your swab results.  You had a few white blood cells on your urine.  I suspect this is more related to your vaginal irritation but we will send this for culture and contact you if we need to change your treatment plan.  Wear loosefitting underwear and use hypoallergenic  soaps and detergents.  If your symptoms are not improving or if anything worsens and you have pelvic pain, abnormal bleeding, persistent discharge, pelvic pain, fever, nausea, vomiting you need to be seen immediately.     ED Prescriptions     Medication Sig Dispense Auth. Provider   metroNIDAZOLE  (FLAGYL ) 500 MG tablet Take 1 tablet (500 mg total) by mouth 2 (two) times daily. 14 tablet Fatmata Legere K, PA-C      PDMP not reviewed this encounter.   Budd Cargo, PA-C 09/26/23 1757

## 2023-09-26 NOTE — ED Triage Notes (Signed)
 Patient presenting with vaginal bleeding and discharge onset 2 weeks ago. States this current period was 10 days late and now ongoing. Denies any itching or trouble going to the bathroom. Having slight lower abdominal cramping.   Prescriptions or OTC medications tried: No

## 2023-09-28 LAB — URINE CULTURE: Culture: 80000 — AB

## 2023-09-29 ENCOUNTER — Telehealth (HOSPITAL_COMMUNITY): Payer: Self-pay

## 2023-09-29 LAB — CERVICOVAGINAL ANCILLARY ONLY
Bacterial Vaginitis (gardnerella): POSITIVE — AB
Candida Glabrata: NEGATIVE
Candida Vaginitis: NEGATIVE
Chlamydia: NEGATIVE
Comment: NEGATIVE
Comment: NEGATIVE
Comment: NEGATIVE
Comment: NEGATIVE
Comment: NEGATIVE
Comment: NORMAL
Neisseria Gonorrhea: NEGATIVE
Trichomonas: NEGATIVE

## 2023-09-29 MED ORDER — CEFIXIME 400 MG PO CAPS: 400.0000 mg | ORAL_CAPSULE | Freq: Every day | ORAL | 0 refills | Status: AC

## 2023-09-29 NOTE — Telephone Encounter (Signed)
 Per Nadyne Austin, PA-C, "Recommend cefixime 400 mg once daily for 10 days." Rx sent to pharmacy on file.  Pt went home on metronidazole  for BV. No change to therapy at this time per protocol.
